# Patient Record
Sex: Male | Born: 1975 | Hispanic: No | Marital: Single | Smoking: Current every day smoker
Health system: Southern US, Community
[De-identification: ages and names within clinical notes are randomized; demographics above are authoritative.]

## PROBLEM LIST (undated history)

## (undated) HISTORY — PX: BACK SURGERY: SHX140

## (undated) HISTORY — PX: CLAVICLE SURGERY: SHX598

## (undated) HISTORY — PX: SHOULDER SURGERY: SHX246

## (undated) HISTORY — PX: HERNIA REPAIR: SHX51

---

## 2009-05-24 ENCOUNTER — Ambulatory Visit (HOSPITAL_BASED_OUTPATIENT_CLINIC_OR_DEPARTMENT_OTHER): Admission: RE | Admit: 2009-05-24 | Discharge: 2009-05-25 | Payer: Self-pay | Admitting: Orthopedic Surgery

## 2014-08-22 ENCOUNTER — Emergency Department (INDEPENDENT_AMBULATORY_CARE_PROVIDER_SITE_OTHER)
Admission: EM | Admit: 2014-08-22 | Discharge: 2014-08-22 | Disposition: A | Discharge status: Home / Self Care | Payer: Managed Care, Other (non HMO) | Source: Home / Self Care | Attending: Family Medicine | Admitting: Family Medicine

## 2014-08-22 ENCOUNTER — Encounter: Payer: Self-pay | Admitting: Emergency Medicine

## 2014-08-22 DIAGNOSIS — L03115 Cellulitis of right lower limb: Secondary | ICD-10-CM | POA: Diagnosis not present

## 2014-08-22 MED ORDER — DOXYCYCLINE HYCLATE 100 MG PO CAPS: 100.0000 mg | ORAL_CAPSULE | Freq: Two times a day (BID) | ORAL | Status: DC

## 2014-08-22 NOTE — ED Provider Notes (Signed)
CSN: [id number]     Arrival date & time 08/22/14  1456 History   First MD Initiated Contact with Patient 08/22/14 1514     Chief Complaint  Patient presents with  . Cellulitis   (Consider location/radiation/quality/duration/timing/severity/associated sxs/prior Treatment) HPI  Patient is a 39 year old male presenting to urgent care with complaint of gradually worsening right lower anterior leg pain, swelling and redness started 3 days ago.  Pain is 8/10 in severity, aching and sore.  Patient states area is warm to touch.  Patient states today he developed nausea, joint aches and fever up to 101.  She was seen by a nurse at his work.  You gave him a cortisone injection and prescribed him prednisone.  Here for second opinion.  Denies known allergies.  He soaks, lotions or medications.  States he did take his ear tack earlier without relief.  Denies known insect bites or injuries to his right leg.  Denies history of DVT or blood clots.  Denies history of diabetes.  Denies any other rashes.  History reviewed. No pertinent past medical history. History reviewed. No pertinent past surgical history. Family History  Problem Relation Age of Onset  . Diabetes Mother   . Diabetes Father    History  Substance Use Topics  . Smoking status: Current Every Day Smoker -- 1.00 packs/day for 25 years  . Smokeless tobacco: Not on file  . Alcohol Use: Yes    Review of Systems  Constitutional: Positive for fever ( Tmax 101) and chills.  Respiratory: Negative for cough and shortness of breath.   Cardiovascular: Negative for chest pain and palpitations.  Gastrointestinal: Positive for nausea. Negative for vomiting, abdominal pain and diarrhea.  Genitourinary: Negative for dysuria, urgency, hematuria and flank pain.  Musculoskeletal: Positive for myalgias, joint swelling and arthralgias.  Skin: Positive for color change and rash. Negative for pallor and wound.  Neurological: Negative for weakness and  numbness.    Allergies  Review of patient's allergies indicates no known allergies.  Home Medications   Prior to Admission medications   Medication Sig Start Date End Date Taking? Authorizing Provider  acetaminophen (TYLENOL) 325 MG tablet Take 650 mg by mouth every 6 (six) hours as needed.   Yes Historical Provider, MD  ibuprofen (ADVIL,MOTRIN) 200 MG tablet Take 200 mg by mouth every 6 (six) hours as needed.   Yes Historical Provider, MD  doxycycline (VIBRAMYCIN) 100 MG capsule Take 1 capsule (100 mg total) by mouth 2 (two) times daily. One po bid x 7 days 08/22/14   Junius FinnerErin O'Malley, PA-C   BP 123/79 mmHg  Pulse 80  Temp(Src) 98.6 F (37 C) (Oral)  Ht 5\' 11"  (1.803 m)  Wt 235 lb (106.595 kg)  BMI 32.79 kg/m2  SpO2 98% Physical Exam  Constitutional: He is oriented to person, place, and time. He appears well-developed and well-nourished.  HENT:  Head: Normocephalic and atraumatic.  Eyes: EOM are normal.  Neck: Normal range of motion.  Cardiovascular: Normal rate and regular rhythm.   Pulses:      Dorsalis pedis pulses are 2+ on the right side.  Pulmonary/Chest: Effort normal. No respiratory distress.  Musculoskeletal: Normal range of motion. He exhibits edema and tenderness.  Right lower leg: mild edema to anterior aspect, worse to proximal medial tibia. FROM Right knee. No calf tenderness. Compartments are soft. (see skin exam)  Neurological: He is alert and oriented to person, place, and time.  Skin: Skin is warm and dry. There is erythema.  Right  lower leg: diffuse erythema and warmth on anterior aspect just below knee and above ankle. No induration or fluctuance. No bleeding or discharge. Scant superficial abrasions or insect bites.   Psychiatric: He has a normal mood and affect. His behavior is normal.  Nursing note and vitals reviewed.   ED Course  Procedures (including critical care time) Labs Review Labs Reviewed - No data to display  Imaging Review No results  found.   MDM   1. Cellulitis of leg, right     Patient is a 39 year old male complaining of right lower leg pain redness and swelling with associated fever, nausea, body aches.  Exam concerning for cellulitis.  No evidence of abscess.  Calf is soft and nontender.  Doubt DVT or compartment syndrome.  Will start patient on doxycycline.  Advised to use acetaminophen and ibuprofen as needed for fever and pain.  Home care instructions, which include elevating the leg and warm compresses provided to patient.  Advised to follow-up with PCP in 3-4 days for recheck of symptoms if not improving.   Discussed symptoms that warrant emergent care by going to the closest ER calling 911. Patient verbalized understanding and agreement with treatment plan.    Junius Finner, PA-C 08/22/14 [phone]

## 2014-08-22 NOTE — Discharge Instructions (Signed)
You may take 400-600mg  Ibuprofen (Motrin) every 6-8 hours for fever and pain  Alternate with Tylenol  You may take  Tylenol every 4-6 hours as needed for fever and pain  Return urgent care or go to closest ER if fever not reducing with Tylenol, significant increase in pain, redness or swelling, or other new concerning symptoms develop.

## 2014-08-22 NOTE — ED Notes (Signed)
Cellulitis, Right leg x 3 days, red, swollen, painful, Had a fever of 101, nausea, joints ache. Saw work Engineer, civil (consulting)nurse today she gave him a cortisone injection, prednisone, but he wants a second opinion.

## 2014-08-25 ENCOUNTER — Telehealth: Payer: Self-pay | Admitting: Emergency Medicine

## 2015-12-05 ENCOUNTER — Encounter: Payer: Self-pay | Admitting: *Deleted

## 2015-12-05 ENCOUNTER — Other Ambulatory Visit: Payer: Self-pay | Admitting: Emergency Medicine

## 2015-12-05 ENCOUNTER — Emergency Department
Admission: EM | Admit: 2015-12-05 | Discharge: 2015-12-05 | Disposition: A | Discharge status: Home / Self Care | Payer: Managed Care, Other (non HMO) | Source: Home / Self Care | Attending: Family Medicine | Admitting: Family Medicine

## 2015-12-05 ENCOUNTER — Emergency Department (INDEPENDENT_AMBULATORY_CARE_PROVIDER_SITE_OTHER): Payer: Managed Care, Other (non HMO)

## 2015-12-05 DIAGNOSIS — R197 Diarrhea, unspecified: Secondary | ICD-10-CM

## 2015-12-05 DIAGNOSIS — R112 Nausea with vomiting, unspecified: Secondary | ICD-10-CM

## 2015-12-05 DIAGNOSIS — R109 Unspecified abdominal pain: Secondary | ICD-10-CM | POA: Diagnosis not present

## 2015-12-05 LAB — POCT CBC W AUTO DIFF (K'VILLE URGENT CARE)

## 2015-12-05 MED ORDER — CIPROFLOXACIN HCL 500 MG PO TABS: 500.0000 mg | ORAL_TABLET | Freq: Two times a day (BID) | ORAL | 0 refills | Status: DC

## 2015-12-05 MED ORDER — IOPAMIDOL (ISOVUE-300) INJECTION 61%: 100.0000 mL | Freq: Once | INTRAVENOUS | Status: AC | PRN

## 2015-12-05 MED ORDER — ONDANSETRON HCL 4 MG PO TABS: 4.0000 mg | ORAL_TABLET | Freq: Four times a day (QID) | ORAL | 0 refills | Status: DC

## 2015-12-05 MED ORDER — ONDANSETRON 4 MG PO TBDP
4.0000 mg | ORAL_TABLET | Freq: Once | ORAL | Status: AC
  Administered 2015-12-05: 4 mg via ORAL

## 2015-12-05 NOTE — ED Provider Notes (Signed)
CSN: [id number]     Arrival date & time 12/05/15  1549 History   First MD Initiated Contact with Patient 12/05/15 1609     Chief Complaint  Patient presents with  . Diarrhea  . Emesis   (Consider location/radiation/quality/duration/timing/severity/associated sxs/prior Treatment) HPI Richard Lowe is a 40 y.o. male presenting to UC with c/o gradually worsening GI symptoms that started around 8AM this morning.  Pt notes he developed epigastric nausea, vomiting and diarrhea with associated epigastric pain that worsened after vomiting.  He has had about 3 episodes of vomiting, last episode was around 1PM.  He reports over 20 episodes of watery diarrhea. Denies blood or mucous in stool.  He has not tried any OTC counter medications for his symptoms as he has only recently this afternoon been able to keep down a few sips of water.  Denies sick contacts or recent travel. He did bake chicken last night and is concerned he may not have cooked it thoroughly.  Denies hx of abdominal surgeries. Denies urinary symptoms. He does have occasional acid reflux but takes OTC Zantac as needed.    History reviewed. No pertinent past medical history. Past Surgical History:  Procedure Laterality Date  . BACK SURGERY    . CLAVICLE SURGERY    . HERNIA REPAIR     Family History  Problem Relation Age of Onset  . Diabetes Mother   . Diabetes Father   . Asthma Brother    Social History  Substance Use Topics  . Smoking status: Current Every Day Smoker    Packs/day: 1.00    Years: 25.00  . Smokeless tobacco: Never Used  . Alcohol use No    Review of Systems  Constitutional: Positive for appetite change. Negative for chills and fever.  HENT: Negative for congestion, ear pain, sore throat, trouble swallowing and voice change.   Respiratory: Negative for cough and shortness of breath.   Cardiovascular: Negative for chest pain and palpitations.  Gastrointestinal: Positive for abdominal pain, diarrhea,  nausea and vomiting. Negative for blood in stool.  Genitourinary: Negative for dysuria, frequency and hematuria.  Musculoskeletal: Negative for arthralgias, back pain and myalgias.  Skin: Negative for rash.  Neurological: Negative for dizziness, light-headedness and headaches.    Allergies  Banana and Eggs or egg-derived products  Home Medications   Prior to Admission medications   Medication Sig Start Date End Date Taking? Authorizing Provider  ciprofloxacin (CIPRO) 500 MG tablet Take 1 tablet (500 mg total) by mouth 2 (two) times daily. One po bid x 7 days 12/05/15   Noland Fordyce, PA-C  ibuprofen (ADVIL,MOTRIN) 200 MG tablet Take 800 mg by mouth every 6 (six) hours as needed.     Historical Provider, MD  ondansetron (ZOFRAN) 4 MG tablet Take 1 tablet (4 mg total) by mouth every 6 (six) hours. 12/05/15   Noland Fordyce, PA-C   Meds Ordered and Administered this Visit   Medications  ondansetron (ZOFRAN-ODT) disintegrating tablet 4 mg (4 mg Oral Given 12/05/15 1619)  ondansetron (ZOFRAN-ODT) disintegrating tablet 4 mg (4 mg Oral Given 12/05/15 1621)    BP 134/82 (BP Location: Left Arm)   Pulse 96   Temp 97.7 F (36.5 C) (Oral)   Resp 18   Ht '5\' 11"'  (1.803 m)   Wt 237 lb (107.5 kg)   SpO2 98%   BMI 33.05 kg/m  No data found.   Physical Exam  Constitutional: He appears well-developed and well-nourished. No distress.  Pt sitting in exam chair, appears  mildly fatigued and does not appear to feel well but is alert and cooperative during exam.  HENT:  Head: Normocephalic and atraumatic.  Mouth/Throat: Oropharynx is clear and moist.  Eyes: Conjunctivae are normal. No scleral icterus.  Neck: Normal range of motion.  Cardiovascular: Normal rate, regular rhythm and normal heart sounds.   Pulmonary/Chest: Effort normal and breath sounds normal. No respiratory distress. He has no wheezes. He has no rales. He exhibits no tenderness.  Abdominal: Soft. Bowel sounds are normal. He  exhibits no distension and no mass. There is generalized tenderness ( diffuse). There is rebound and guarding.  Soft obese abdomen, diffuse tenderness including RLQ and epigastrium with rebound and guarding.    Musculoskeletal: Normal range of motion.  Neurological: He is alert.  Skin: Skin is warm and dry. He is not diaphoretic.  Nursing note and vitals reviewed.   Urgent Care Course   Clinical Course    Procedures (including critical care time)  Labs Review Labs Reviewed  GASTROINTESTINAL PANEL BY PCR, STOOL (REPLACES STOOL CULTURE)  COMPLETE METABOLIC PANEL WITH GFR  LIPASE  POCT CBC W AUTO DIFF ([facility])    Imaging Review Ct Abdomen Pelvis W Contrast  Result Date: 12/05/2015 CLINICAL DATA:  Nausea, vomiting, diarrhea and lower abdominal pain since last night. EXAM: CT ABDOMEN AND PELVIS WITH CONTRAST TECHNIQUE: Multidetector CT imaging of the abdomen and pelvis was performed using the standard protocol following bolus administration of intravenous contrast. CONTRAST:  100 mL of Isovue-300 intravenous contrast COMPARISON:  None. FINDINGS: Lower chest: Normal. Hepatobiliary: Irregular hypo attenuating lesion with somewhat ill-defined margins noted in the posterior segment of the right liver lobe measuring 22 x 13 x 18 mm in size. This is not covered on the field of view for the delayed sequence. This may reflect a hemangioma but is nonspecific. No other liver lesions. Normal gallbladder. No bile duct dilation. Pancreas: Unremarkable. No pancreatic ductal dilatation or surrounding inflammatory changes. Spleen: Normal in size without focal abnormality. Adrenals/Urinary Tract: No adrenal masses. 2.5 cm left midpole renal cyst. No other renal masses or lesions. No stones. No hydronephrosis. Normal ureters. Bladder is unremarkable. Stomach/Bowel: Stomach is within normal limits. Appendix appears normal. No evidence of bowel wall thickening, distention, or inflammatory changes.  Vascular/Lymphatic: Mildly prominent gastrohepatic ligament lymph nodes, largest measuring 15 mm in short axis. No other adenopathy. No vascular abnormality. Reproductive: Prostate is unremarkable. Other: No abdominal wall hernia or abnormality. No abdominopelvic ascites. Musculoskeletal: Postsurgical changes at T11-T12. 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 No osteoblastic or osteolytic lesions. IMPRESSION: 1. No acute findings. No evidence of bowel obstruction or inflammation. 2. **An incidental finding of potential clinical significance has been found. ** 2.2 cm low-density lesion in the right liver lobe, nonspecific, with somewhat ill-defined margins. Followup recommended with liver MRI with and without contrast. This recommendation follows consensus guidelines for incidental liver masses detected on CT. Electronically Signed   By: Lajean Manes M.D.   On: 12/05/2015 17:06    MDM   1. Nausea vomiting and diarrhea   2. Abdominal pain    Pt presenting to UC with c/o n/v/d and epigastric pain that has gradually worsened since 8AM this morning.   CBC: WBC- 20.1 CMP and Lipase: Pending. Concern for appendicitis or other intraabdominal infection such as cholecystitis or diverticulitis.   Discussed options for CT scan.  Pt would prefer to stay at [facility] for scan. CT scan: No evidence of acute findings.  Normal gallbladder, appendix, and pancreas.  No evidence of bowel obstruction.  Incidental finding of nonspecific liver mass discussed with pt. Encouraged to get f/u MRI scheduled through his PCP.  Will send stool culture. Pt unable to provide sample in UC. Pt sent home with kit, advised to bring back as soon as possible.  Rx: Zofran and Cipro (has had before w/o side effects. advised not to start cipro until stool sample collected, may wait on results if feeling better, however, pt is headed to [facility] on Sunday 10/29 and hopes to be better by then)  Discussed symptoms  that warrant emergent care in the ED. Patient verbalized understanding and agreement with treatment plan.     Noland Fordyce, PA-C 12/05/15 1755

## 2015-12-05 NOTE — Discharge Instructions (Signed)
° °  Your CT scan today did not show signs of appendicitis, however, your white blood cell count was elevated, which typically indicates infection. Please provide a stool sample as soon as possible so we can send it to the lab for further testing.    If your diarrhea does not improve by tomorrow, you may start taking the antibiotic, Ciprofloxacin, however, try not to start the antibiotic before providing stool sample as it may alter the results.  If symptoms worsen significantly this evening, please go to closest emergency department or call 911.    Your CT scan also showed an incidental findings of a nonspecific mass on your liver. It is recommended you follow up with your primary care provider to schedule a follow up MRI for further evaluation of the mass.

## 2015-12-05 NOTE — ED Triage Notes (Signed)
Pt c/o diarrhea, vomiting, epigastric pain, and bloating x today.

## 2015-12-06 LAB — COMPLETE METABOLIC PANEL WITH GFR
ALT: 38 U/L (ref 9–46)
AST: 24 U/L (ref 10–40)
Albumin: 4.7 g/dL (ref 3.6–5.1)
Alkaline Phosphatase: 61 U/L (ref 40–115)
BUN: 19 mg/dL (ref 7–25)
CO2: 23 mmol/L (ref 20–31)
Calcium: 10.3 mg/dL (ref 8.6–10.3)
Chloride: 103 mmol/L (ref 98–110)
Creat: 1.27 mg/dL (ref 0.60–1.35)
GFR, Est African American: 81 mL/min (ref >=60)
GFR, Est Non African American: 70 mL/min (ref >=60)
Glucose, Bld: 104 mg/dL — ABNORMAL HIGH (ref 65–99)
Potassium: 4.8 mmol/L (ref 3.5–5.3)
Sodium: 140 mmol/L (ref 135–146)
Total Bilirubin: 0.8 mg/dL (ref 0.2–1.2)
Total Protein: 7.4 g/dL (ref 6.1–8.1)

## 2015-12-06 LAB — LIPASE: Lipase: 16 U/L (ref 7–60)

## 2015-12-08 ENCOUNTER — Telehealth: Payer: Self-pay | Admitting: Emergency Medicine

## 2015-12-08 NOTE — Telephone Encounter (Signed)
Patient states he is feeling much better. Gave him normal blood test results, and will call when stool studies are completed.

## 2015-12-11 LAB — GASTROINTESTINAL PATHOGEN PANEL PCR
C. difficile Tox A/B, PCR: DETECTED — CR
Campylobacter, PCR: NOT DETECTED
Cryptosporidium, PCR: NOT DETECTED
E coli (ETEC) LT/ST PCR: NOT DETECTED
E coli (STEC) stx1/stx2, PCR: NOT DETECTED
E coli 0157, PCR: NOT DETECTED
Giardia lamblia, PCR: NOT DETECTED
Norovirus, PCR: NOT DETECTED
Rotavirus A, PCR: NOT DETECTED
Salmonella, PCR: NOT DETECTED
Shigella, PCR: NOT DETECTED

## 2015-12-11 NOTE — Telephone Encounter (Signed)
[facility] to [facility]. GI panel still in process, started on 12/07/15. Should result 10/31 or 12/12/2015

## 2015-12-12 ENCOUNTER — Telehealth: Payer: Self-pay | Admitting: *Deleted

## 2015-12-12 NOTE — Telephone Encounter (Signed)
LM to call back for Gastrointestinal panel results and per dr Cathren Harshbeese call in flagyl 500mg  q 8hrs for 10 days # 30/0rf. Clemens Catholichristy Nancylee Gaines, LPN

## 2015-12-12 NOTE — Telephone Encounter (Signed)
Patient called back from [place] in [place]; gave him lab report that he was c.diff positive and that Dr.Beese would like him to take Flagyl 500mg  poq 8 hours for 10 days =#30. Patient says he will wait until he returns, since he is doing well, and will call us so we can send rx to pharmacy of choice; he also mentione that Gershon MusselO'Malley thought he should have a liver MRI due to scan results.

## 2015-12-18 ENCOUNTER — Telehealth: Payer: Self-pay | Admitting: *Deleted

## 2015-12-18 NOTE — Telephone Encounter (Signed)
LM to call back with which pharmacy he would like the rx for flagyl called into. See previous note for flagyl rx. Clemens Catholichristy Kiaira Pointer, LPN

## 2015-12-20 ENCOUNTER — Telehealth: Payer: Self-pay

## 2015-12-20 NOTE — Telephone Encounter (Signed)
Left message for patient to call with pharmacy choise for medication.

## 2015-12-21 ENCOUNTER — Telehealth: Payer: Self-pay | Admitting: Emergency Medicine

## 2015-12-21 NOTE — Telephone Encounter (Signed)
Patient called in response to our messages for him to be in touch with the pharmacy of choice to start flagyl 500mg  po q 8 hours for 10 days. He is doing much better.

## 2016-07-30 ENCOUNTER — Emergency Department (INDEPENDENT_AMBULATORY_CARE_PROVIDER_SITE_OTHER)
Admission: EM | Admit: 2016-07-30 | Discharge: 2016-07-30 | Disposition: A | Discharge status: Home / Self Care | Payer: Managed Care, Other (non HMO) | Source: Home / Self Care | Attending: Family Medicine | Admitting: Family Medicine

## 2016-07-30 ENCOUNTER — Encounter: Payer: Self-pay | Admitting: *Deleted

## 2016-07-30 DIAGNOSIS — R1031 Right lower quadrant pain: Secondary | ICD-10-CM

## 2016-07-30 DIAGNOSIS — M546 Pain in thoracic spine: Secondary | ICD-10-CM | POA: Diagnosis not present

## 2016-07-30 DIAGNOSIS — R109 Unspecified abdominal pain: Secondary | ICD-10-CM

## 2016-07-30 LAB — POCT CBC W AUTO DIFF (K'VILLE URGENT CARE)

## 2016-07-30 LAB — POCT URINALYSIS DIP (MANUAL ENTRY)
Bilirubin, UA: NEGATIVE
Blood, UA: NEGATIVE
Glucose, UA: NEGATIVE mg/dL
Ketones, POC UA: NEGATIVE mg/dL
Leukocytes, UA: NEGATIVE
Nitrite, UA: NEGATIVE
Protein Ur, POC: NEGATIVE mg/dL
Spec Grav, UA: 1.02 (ref 1.010–1.025)
Urobilinogen, UA: 0.2 E.U./dL
pH, UA: 6.5 (ref 5.0–8.0)

## 2016-07-30 MED ORDER — METHOCARBAMOL 500 MG PO TABS: 500.0000 mg | ORAL_TABLET | Freq: Two times a day (BID) | ORAL | 0 refills | Status: AC

## 2016-07-30 NOTE — Discharge Instructions (Signed)
°  Robaxin (methocarbamol) is a muscle relaxer and may cause drowsiness. Do not drink alcohol, drive, or operate heavy machinery while taking.  If you develop a fever, worsening pain, vomiting, diarrhea, or other new concerning symptoms, please be re-evaluated by a medical professional. Either, return to UC, follow up with primary care, or if severe, please call 911 or go to closest emergency department.  [facility]- we have Ultrasound and CT during the week, typically 8-4pm If you please you may need imaging, please come to UC no later than 3PM during the week.
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
## 2016-07-30 NOTE — ED Triage Notes (Signed)
Pt c/o low mid back pain x 3 days that radiates down his RT groin and leg. He took 2 Aleve at 0600 today. Denies injury.

## 2019-07-26 ENCOUNTER — Emergency Department (INDEPENDENT_AMBULATORY_CARE_PROVIDER_SITE_OTHER): Payer: Managed Care, Other (non HMO)

## 2019-07-26 ENCOUNTER — Other Ambulatory Visit: Payer: Self-pay

## 2019-07-26 ENCOUNTER — Encounter: Payer: Self-pay | Admitting: Emergency Medicine

## 2019-07-26 ENCOUNTER — Emergency Department (INDEPENDENT_AMBULATORY_CARE_PROVIDER_SITE_OTHER)
Admission: EM | Admit: 2019-07-26 | Discharge: 2019-07-26 | Disposition: A | Discharge status: Home / Self Care | Payer: Managed Care, Other (non HMO) | Source: Home / Self Care | Attending: Family Medicine | Admitting: Family Medicine

## 2019-07-26 DIAGNOSIS — M79602 Pain in left arm: Secondary | ICD-10-CM

## 2019-07-26 DIAGNOSIS — M5412 Radiculopathy, cervical region: Secondary | ICD-10-CM

## 2019-07-26 DIAGNOSIS — M542 Cervicalgia: Secondary | ICD-10-CM

## 2019-07-26 MED ORDER — PREDNISONE 20 MG PO TABS: ORAL_TABLET | ORAL | 0 refills | Status: AC

## 2019-07-26 NOTE — ED Provider Notes (Signed)
[facility]    CSN: [id number] Arrival date & time: 07/26/19  5329      History   Chief Complaint Chief Complaint  Patient presents with   Chest Pain    HPI Richard Lowe is a 44 y.o. male.   Patient complains of a vague sensation of numbness in his left arm and decreased left hand grip strength for about a week.  The paresthesia is along the triceps area of his left arm wrapping around anteriorly to his elbow and forearm.  Yesterday he developed vague pain in his left upper chest and he became concerned about his heart.  He denies nausea/vomiting, sweating, shortness of breath.  He has a family history of CV disease (MI in father).    Chest Pain Pain location:  L chest Pain quality: aching   Pain radiates to:  Does not radiate Pain severity:  Mild Onset quality:  Sudden Duration:  1 day Timing:  Intermittent Progression:  Unchanged Chronicity:  New Context comment:  Head/neck movement Relieved by:  Certain positions Worsened by:  Certain positions Ineffective treatments: Ibuprofen. Associated symptoms: numbness   Associated symptoms: no abdominal pain, no back pain, no cough, no diaphoresis, no dysphagia, no fatigue, no fever, no heartburn, no nausea, no palpitations, no shortness of breath and no vomiting   Risk factors: smoking     History reviewed. No pertinent past medical history.  There are no problems to display for this patient.   Past Surgical History:  Procedure Laterality Date   BACK SURGERY     CLAVICLE SURGERY     HERNIA REPAIR     SHOULDER SURGERY         Home Medications    Prior to Admission medications   Medication Sig Start Date End Date Taking? Authorizing Provider  ibuprofen (ADVIL) 800 MG tablet Take 800 mg by mouth every 8 (eight) hours as needed.   Yes [provider]  levothyroxine (SYNTHROID) 100 MCG tablet Take 100 mcg by mouth daily before breakfast.   Yes [provider]  omeprazole  (PRILOSEC) 40 MG capsule Take 40 mg by mouth daily.   Yes [provider]  methocarbamol (ROBAXIN) 500 MG tablet Take 1 tablet (500 mg total) by mouth 2 (two) times daily. 07/30/16   Lurene Shadow, PA-C  predniSONE (DELTASONE) 20 MG tablet Take one tab by mouth twice daily for 4 days, then one daily for 3 days. Take with food. 07/26/19   Lattie Haw, MD    Family History Family History  Problem Relation Age of Onset   Diabetes Mother    Diabetes Father    Asthma Brother     Social History Social History   Tobacco Use   Smoking status: Current Every Day Smoker    Packs/day: 0.50    Years: 25.00    Pack years: 12.50    Types: Cigarettes   Smokeless tobacco: Never Used  Building services engineer Use: Never used  Substance Use Topics   Alcohol use: No   Drug use: No     Allergies   Banana and Eggs or egg-derived products   Review of Systems Review of Systems  Constitutional: Negative for activity change, chills, diaphoresis, fatigue and fever.  HENT: Negative.  Negative for trouble swallowing.   Eyes: Negative.   Respiratory: Positive for chest tightness. Negative for cough, shortness of breath, wheezing and stridor.   Cardiovascular: Positive for chest pain. Negative for palpitations and leg swelling.  Gastrointestinal: Negative for abdominal pain, heartburn, nausea and vomiting.  Genitourinary: Negative.   Musculoskeletal: Positive for neck pain and neck stiffness. Negative for back pain.  Skin: Negative for rash.  Neurological: Positive for numbness.     Physical Exam Triage Vital Signs ED Triage Vitals  Enc Vitals Group     BP 07/26/19 0937 (!) 144/82     Pulse Rate 07/26/19 0937 (!) 57     Resp --      Temp 07/26/19 0937 98.1 F (36.7 C)     Temp Source 07/26/19 0937 Oral     SpO2 07/26/19 0937 98 %     Weight 07/26/19 0940 255 lb (115.7 kg)     Height 07/26/19 0940 5\' 11"  (1.803 m)     Head Circumference --      Peak Flow --      Pain  Score 07/26/19 0939 4     Pain Loc --      Pain Edu? --      Excl. in GC? --    No data found.  Updated Vital Signs BP (!) 144/82 (BP Location: Right Arm)    Pulse (!) 57    Temp 98.1 F (36.7 C) (Oral)    Ht 5\' 11"  (1.803 m)    Wt 115.7 kg    SpO2 98%    BMI 35.57 kg/m   Visual Acuity Right Eye Distance:   Left Eye Distance:   Bilateral Distance:    Right Eye Near:   Left Eye Near:    Bilateral Near:     Physical Exam Vitals and nursing note reviewed.  Constitutional:      General: He is not in acute distress. HENT:     Head: Normocephalic.     Right Ear: External ear normal.     Left Ear: External ear normal.     Nose: Nose normal.     Mouth/Throat:     Pharynx: Oropharynx is clear.  Eyes:     Extraocular Movements: Extraocular movements intact.     Conjunctiva/sclera: Conjunctivae normal.     Pupils: Pupils are equal, round, and reactive to light.  Neck:      Comments: Patient has vague tenderness to palpation left trapezius and shoulder area. Neck forward flexion causes increased pain in his left neck and shoulder area. Bilateral neck flexion causes increased pain in his left neck and shoulder    Cardiovascular:     Rate and Rhythm: Bradycardia present.     Heart sounds: Normal heart sounds.  Pulmonary:     Breath sounds: Normal breath sounds.  Abdominal:     Tenderness: There is no abdominal tenderness.  Musculoskeletal:        General: No tenderness.     Right lower leg: No edema.     Left lower leg: No edema.  Skin:    General: Skin is warm and dry.     Findings: No rash.  Neurological:     Mental Status: He is alert.      UC Treatments / Results  Labs (all labs ordered are listed, but only abnormal results are displayed) Labs Reviewed - No data to display  EKG  Rate:  57 BPM PR:  184 msec QT:  396 msec QTcH:  385 msec QRSD:  102 msec QRS axis:  80 degrees Interpretation:  Sinus bradycardia, otherwise within normal limits    Radiology DG Cervical Spine Complete  Result Date: 07/26/2019 CLINICAL DATA:  Neck and left  arm pain. EXAM: CERVICAL SPINE - COMPLETE 4+ VIEW COMPARISON:  No prior. FINDINGS: Diffuse multilevel degenerative change. Degenerative change C6-C7 at which level there is prominent disc space loss and endplate osteophyte formation. Noted is bilateral C3-C4 neural foraminal narrowing secondary to facet hypertrophy and endplate osteophyte formation. No acute abnormality identified. No evidence of fracture or dislocation. Plate and screw fixation left clavicle. 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 No acute abnormality. Electronically Signed   By: Marcello Moores  Register   On: 07/26/2019 10:35    Procedures Procedures (including critical care time)  Medications Ordered in UC Medications - No data to display  Initial Impression / Assessment and Plan / UC Course  I have reviewed the triage vital signs and the nursing notes.  Pertinent labs & imaging results that were available during my care of the patient were reviewed by me and considered in my medical decision making (see chart for details).     Multi-level cervical radiculopathy. Upper anterior chest pain apparently radicular pain; negative EKG reassuring. Begin prednisone burst/taper. Followup with Dr. Aundria Mems ([facility]) in about one week for further evaluation.   Final Clinical Impressions(s) / UC Diagnoses   Final diagnoses:  Left cervical radiculopathy     Discharge Instructions     Apply ice pack to back of neck for 20 to 30 minutes, 3 to 4 times daily  Continue until pain decreases.  May take Tylenol as needed for pain. Try wearing a soft cervical collar.    ED Prescriptions    Medication Sig Dispense Auth. Provider   predniSONE (DELTASONE) 20 MG tablet Take one tab by mouth twice daily for 4 days, then one daily for 3 days. Take with  food. 11 tablet Kandra Nicolas, MD        Kandra Nicolas, MD 07/27/19 2004

## 2019-07-26 NOTE — ED Triage Notes (Signed)
Pinched nerve in left  shoulder, feeling pressure in neck and ears. Left arm feels like its asleep, SOB x 3 days.Chest pressure.

## 2019-07-26 NOTE — Discharge Instructions (Addendum)
Apply ice pack to back of neck for 20 to 30 minutes, 3 to 4 times daily  Continue until pain decreases.  May take Tylenol as needed for pain. Try wearing a soft cervical collar.

## 2021-03-16 IMAGING — DX DG CERVICAL SPINE COMPLETE 4+V
5 series · 5 of 5 positions shown · non-contrast
Comparison: No prior.

CLINICAL DATA: Neck and left arm pain.

EXAM:
CERVICAL SPINE - COMPLETE 4+ VIEW

[c-spine obl (1 of 2)]
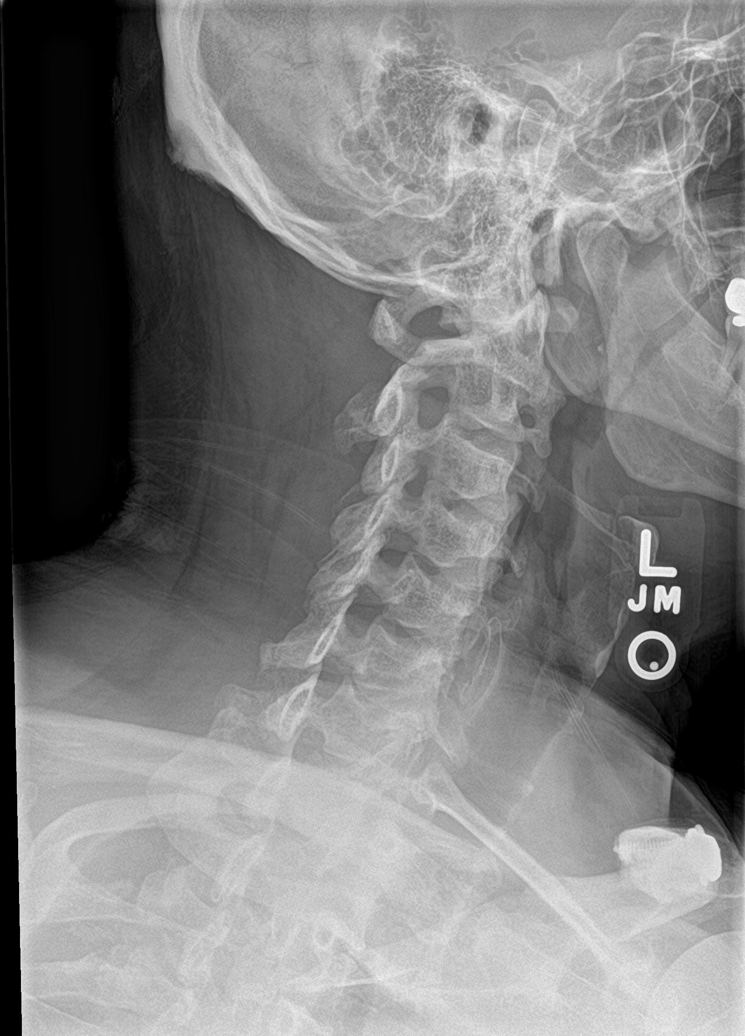

[c-spine obl (2 of 2)]
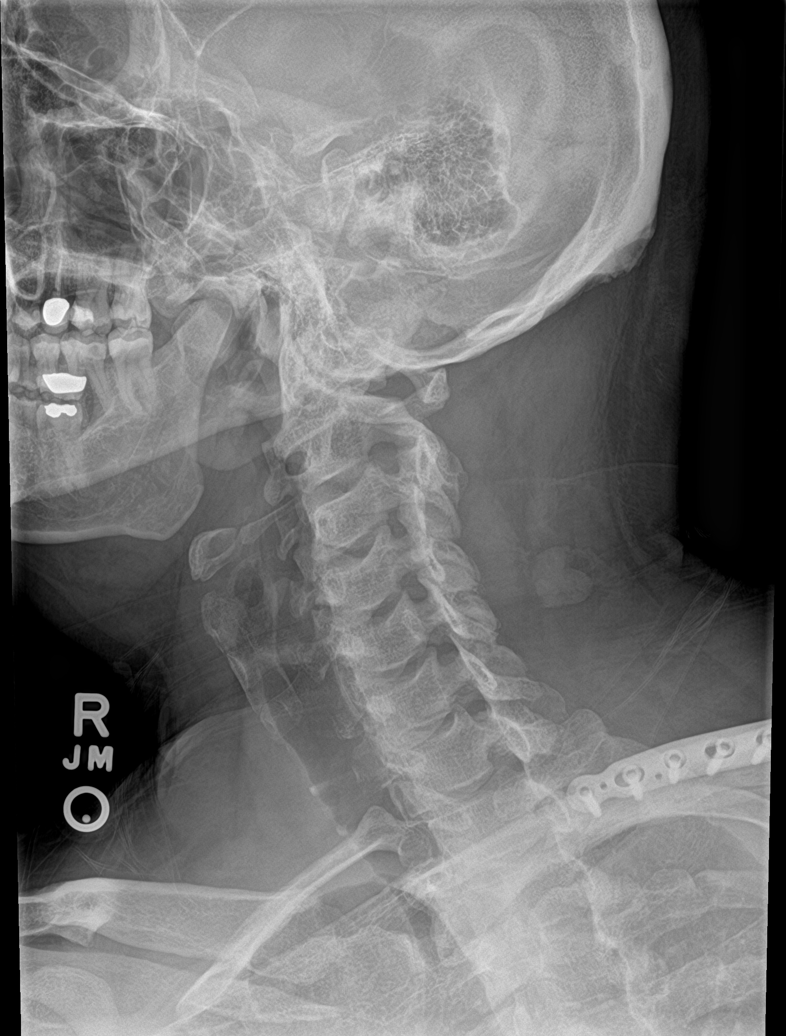

[c-spine ap]
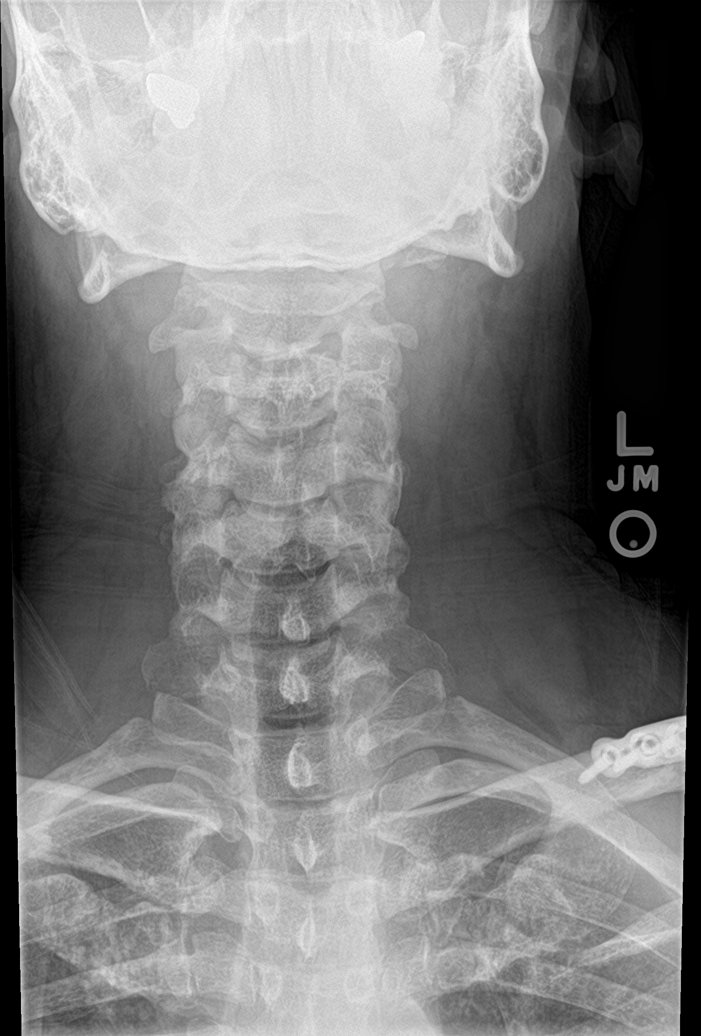

[c-spine open mouth]
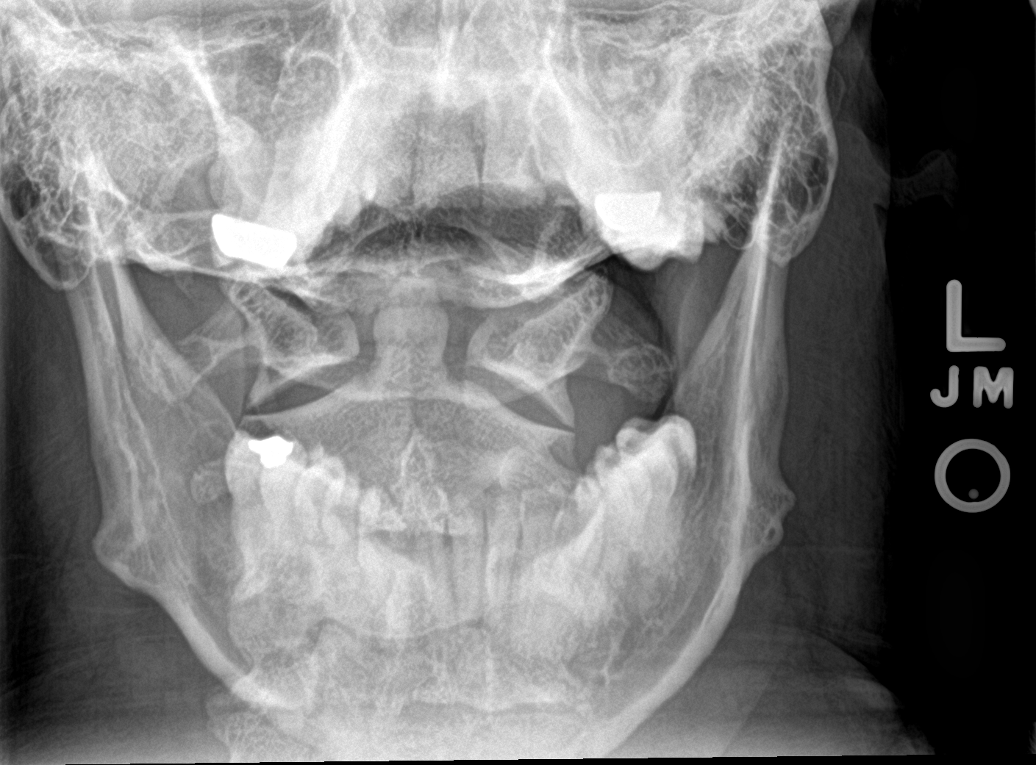

[c-spine swimmers]
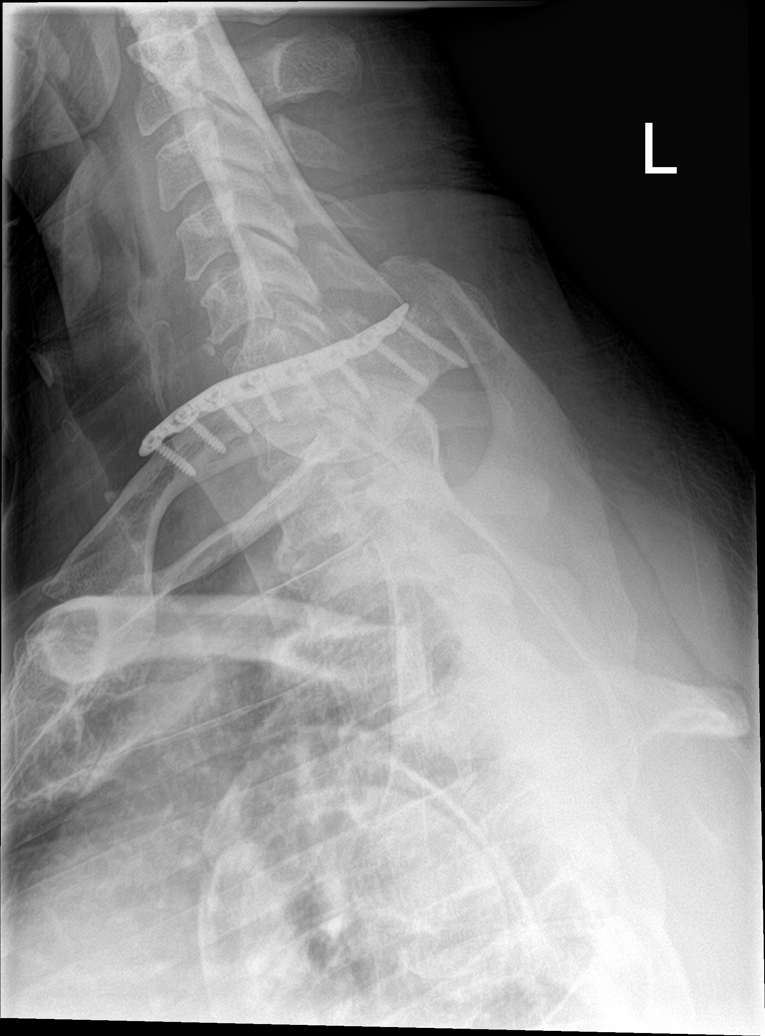

[5 of 5 positions shown; findings below may reference images not displayed]

FINDINGS: Diffuse multilevel degenerative change. Degenerative change C6-C7 at
which level there is prominent disc space loss and endplate
osteophyte formation. Noted is bilateral C3-C4 neural foraminal
narrowing secondary to facet hypertrophy and endplate osteophyte
formation. No acute abnormality identified. No evidence of fracture
or dislocation. Plate and screw fixation left clavicle.
IMPRESSION: Diffuse multilevel degenerative change. Degenerative bilateral C3-C4
neural foraminal narrowing. No acute abnormality.
